# Patient Record
Sex: Female | Born: 1995
Health system: Southern US, Community
[De-identification: ages and names within clinical notes are randomized; demographics above are authoritative.]

## PROBLEM LIST (undated history)

## (undated) DIAGNOSIS — F39 Unspecified mood [affective] disorder: Secondary | ICD-10-CM

## (undated) DIAGNOSIS — N12 Tubulo-interstitial nephritis, not specified as acute or chronic: Secondary | ICD-10-CM

## (undated) DIAGNOSIS — F900 Attention-deficit hyperactivity disorder, predominantly inattentive type: Secondary | ICD-10-CM

## (undated) DIAGNOSIS — R109 Unspecified abdominal pain: Secondary | ICD-10-CM

## (undated) HISTORY — DX: Unspecified mood (affective) disorder: F39

## (undated) HISTORY — DX: Tubulo-interstitial nephritis, not specified as acute or chronic: N12

## (undated) HISTORY — DX: Unspecified abdominal pain: R10.9

## (undated) HISTORY — DX: Attention-deficit hyperactivity disorder, predominantly inattentive type: F90.0

---
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
## 2015-01-09 ENCOUNTER — Ambulatory Visit: Payer: Medicaid Other | Admitting: Pediatrics

## 2015-01-09 ENCOUNTER — Encounter: Payer: Self-pay | Admitting: Pediatrics

## 2015-01-09 NOTE — Progress Notes (Signed)
Pre-Visit Planning  Savannah Goodwin  is a 19 y.o. female referred by Clint GuySMITH,ESTHER P, MD.   Last seen in [facility] on 10/12/2014 for ADHD, nexplanon f/u.   Previous Psych Screenings? yes, 10/13/2014  Treatment plan at last visit included restart Adderall at 10 mg and then increase to 20 mg if needed.  Cont nexplanon.   Clinical Staff Visit Tasks:   - Urine GC/CT due? no - Psych Screenings Due? yes, ASRS, PHQSADs - FS Hgb  Provider Visit Tasks: - Assess ADHD - Assess medication compliance, benefits and side effects  - [facility] Involvement? Maybe - Pertinent Labs? yes Component     Latest Ref Rng 02/22/2014 10/12/2014  Hemoglobin     12.2 - 16.2 g/dL [phone] (A) [phone] (A)

## 2015-03-17 ENCOUNTER — Ambulatory Visit (INDEPENDENT_AMBULATORY_CARE_PROVIDER_SITE_OTHER): Payer: BLUE CROSS/BLUE SHIELD | Admitting: Pediatrics

## 2015-03-17 ENCOUNTER — Encounter: Payer: Self-pay | Admitting: Pediatrics

## 2015-03-17 VITALS — BP 115/71 | HR 90 | Ht 64.57 in | Wt 173.8 lb

## 2015-03-17 DIAGNOSIS — F900 Attention-deficit hyperactivity disorder, predominantly inattentive type: Secondary | ICD-10-CM

## 2015-03-17 DIAGNOSIS — Z3046 Encounter for surveillance of implantable subdermal contraceptive: Secondary | ICD-10-CM

## 2015-03-17 DIAGNOSIS — Z130 Encounter for screening for diseases of the blood and blood-forming organs and certain disorders involving the immune mechanism: Secondary | ICD-10-CM

## 2015-03-17 LAB — POCT HEMOGLOBIN: HEMOGLOBIN: 11.9 g/dL — AB (ref 12.2–16.2)

## 2015-03-17 MED ORDER — AMPHETAMINE-DEXTROAMPHET ER 20 MG PO CP24: 20.0000 mg | ORAL_CAPSULE | Freq: Every day | ORAL | Status: DC

## 2015-03-17 NOTE — Progress Notes (Signed)
THIS RECORD MAY CONTAIN CONFIDENTIAL INFORMATION THAT SHOULD NOT BE RELEASED WITHOUT REVIEW OF THE SERVICE PROVIDER.  Adolescent Medicine Consultation Follow-Up Visit Savannah Goodwin  is a 20 y.o. female referred by Clint Guy, MD here today for follow-up.    Previsit planning completed:  yes Pre-Visit Planning  Savannah Goodwin  is a 20 y.o. female referred by Clint Guy, MD.   Last seen in [facility] on 10/12/2014 for ADHD, nexplanon f/u.   Previous Psych Screenings? yes, 10/13/2014  Treatment plan at last visit included restart Adderall at 10 mg and then increase to 20 mg if needed.  Cont nexplanon.   Clinical Staff Visit Tasks:   - Urine GC/CT due? no - Psych Screenings Due? yes, ASRS, PHQSADs - FS Hgb  Provider Visit Tasks: - Assess ADHD - Assess medication compliance, benefits and side effects  - [facility] Involvement? Maybe - Pertinent Labs? yes Component     Latest Ref Rng 02/22/2014 10/12/2014  Hemoglobin     12.2 - 16.2 g/dL 16.1 (A) 09.6 (A)   Growth Chart Viewed? yes   History was provided by the patient.  PCP Confirmed?  yes  My Chart Activated?   yes   HPI:    On Adderall 20 mg, not taking it anymore because she ran out.  Has found it helpful in the past.  Using organizational strategies which helps.  Adderall helps her stay on task. Still some unpredictable bleeding with nexplanon. Had some urinary frequency and stomach pain.  Not happening at the same time.  May be somewhat constipated.  Does not go every day, maybe every other day.  Urinary symptoms resolved.  Patient's last menstrual period was 02/20/2015 (approximate). Allergies  Allergen Reactions  . Tree Extract Anaphylaxis    TREE NUTS   Outpatient Encounter Prescriptions as of 03/17/2015  Medication Sig  . cetirizine (ZYRTEC) 10 MG tablet Take 1 tablet (10 mg total) by mouth daily.  . clotrimazole (LOTRIMIN) 1 % cream Apply 1 application topically 2 (two) times daily.  .  hydrocortisone 2.5 % cream Apply topically daily as needed. Mixed 1:1 with Eucerin Cream.  . hydrocortisone 2.5 % ointment Apply topically 2 (two) times daily as needed.  . triamcinolone ointment (KENALOG) 0.1 % Apply 1 application topically 2 (two) times daily as needed. Do not use on face. Use sparingly.  Marland Kitchen amphetamine-dextroamphetamine (ADDERALL XR) 20 MG 24 hr capsule Take 1 capsule (20 mg total) by mouth daily with breakfast. FILL ON OR AFTER 04/14/2015  . amphetamine-dextroamphetamine (ADDERALL XR) 20 MG 24 hr capsule Take 1 capsule (20 mg total) by mouth daily with breakfast. FILL ON OR AFTER 05/15/2015  . amphetamine-dextroamphetamine (ADDERALL XR) 20 MG 24 hr capsule Take 1 capsule (20 mg total) by mouth daily with breakfast.  . [DISCONTINUED] amphetamine-dextroamphetamine (ADDERALL XR) 20 MG 24 hr capsule Take 1 capsule (20 mg total) by mouth daily with breakfast. FILL ON OR AFTER 12/21/2014 (Patient not taking: Reported on 03/17/2015)  . [DISCONTINUED] amphetamine-dextroamphetamine (ADDERALL XR) 20 MG 24 hr capsule Take 1 capsule (20 mg total) by mouth daily with breakfast. (Patient not taking: Reported on 03/17/2015)  . [DISCONTINUED] hydrOXYzine (ATARAX/VISTARIL) 25 MG tablet Take 1 tablet (25 mg total) by mouth 3 (three) times daily as needed. (Patient not taking: Reported on 03/17/2015)   No facility-administered encounter medications on file as of 03/17/2015.     Patient Active Problem List   Diagnosis Date Noted  . Surveillance of implantable subdermal contraceptive 06/27/2014  .  Patient requests no residents 08/27/2013  . Allergic rhinitis 07/06/2013  . ADHD (attention deficit hyperactivity disorder), inattentive type 04/05/2013  . Overweight(278.02) 04/05/2013  . History of pyelonephritis 08/28/2006  . Eczema 08/28/2006    Social History   Social History Narrative   Lives in the dorm.  Has a boyfriend.  He is going into the [employer].  Talking about getting married when he leaves.      Confidentiality was discussed with the patient and if applicable, with caregiver as well.      Patient's personal or confidential phone number: [phone]   Tobacco?  no   Drugs/ETOH?  no   Partner preference?  female Sexually Active?  yes    Pregnancy Prevention:  implant, reviewed condoms & plan B   Safe at home, in school & in relationships?  yes   Safe to self?   yes   Guns in the home?  no          The following portions of the patient's history were reviewed and updated as appropriate: allergies, current medications, past social history and problem list.  Physical Exam:  Filed Vitals:   03/17/15 1118  BP: 115/71  Pulse: 90  Height: 5' 4.57" (1.64 m)  Weight: 173 lb 12.8 oz (78.835 kg)   BP 115/71 mmHg  Pulse 90  Ht 5' 4.57" (1.64 m)  Wt 173 lb 12.8 oz (78.835 kg)  BMI 29.31 kg/m2  LMP 02/20/2015 (Approximate) Body mass index: body mass index is 29.31 kg/(m^2). Blood pressure percentiles are 68% systolic and 73% diastolic based on 2000 NHANES data. Blood pressure percentile targets: 90: 123/78, 95: 127/82, 99 + 5 mmHg: 139/95.  Physical Exam  Constitutional: No distress.  Neck: No thyromegaly present.  Cardiovascular: Normal rate and regular rhythm.   No murmur heard. Pulmonary/Chest: Breath sounds normal.  Abdominal: Soft. There is no tenderness. There is no guarding.  Musculoskeletal: She exhibits no edema.  Lymphadenopathy:    She has no cervical adenopathy.  Neurological: She is alert.  Nursing note and vitals reviewed.  PHQ-SADS 03/20/2015 10/13/2014  PHQ-15 2 1   GAD-7 1 4   PHQ-9 1 6   Suicidal Ideation No No  Comment Not at all difficult Very difficult   ASRS 03/20/2015 10/13/2014  Part A Total Symptoms Positive 3 5  Part B Total Symptoms Positive 2 7   Results for orders placed or performed in visit on 03/17/15  POCT hemoglobin  Result Value Ref Range   Hemoglobin 11.9 (A) 12.2 - 16.2 g/dL     Assessment/Plan: 1. ADHD (attention deficit  hyperactivity disorder), inattentive type Symptoms well controlled with current medication regimen, no current comorbidities or side effects. - amphetamine-dextroamphetamine (ADDERALL XR) 20 MG 24 hr capsule; Take 1 capsule (20 mg total) by mouth daily with breakfast. FILL ON OR AFTER 04/14/2015  Dispense: 20 capsule; Refill: 0 - amphetamine-dextroamphetamine (ADDERALL XR) 20 MG 24 hr capsule; Take 1 capsule (20 mg total) by mouth daily with breakfast. FILL ON OR AFTER 05/15/2015  Dispense: 30 capsule; Refill: 0 - amphetamine-dextroamphetamine (ADDERALL XR) 20 MG 24 hr capsule; Take 1 capsule (20 mg total) by mouth daily with breakfast.  Dispense: 30 capsule; Refill: 0  2. Surveillance of implantable subdermal contraceptive Continue with this current method of pregnancy prevention.  3. Screening for iron deficiency anemia - POCT hemoglobin  - Mild anemia, advised to start OTC iron or increase iron rich foods  Discussed urinary symptoms.  Advised to call to be seen same day if symptoms  recur.  Advised to work increasing stool frequency.  Follow-up:  Return in about 3 months (around 06/14/2015) for Med f/u, with any available [facility] Provider.   Medical decision-making:  > 25 minutes spent, more than 50% of appointment was spent discussing diagnosis and management of symptoms

## 2015-03-17 NOTE — Patient Instructions (Signed)
Try pear or prune juice or dried fruits.  Try raisonettes.

## 2015-06-07 ENCOUNTER — Encounter: Payer: Self-pay | Admitting: Pediatrics

## 2015-06-07 ENCOUNTER — Ambulatory Visit (INDEPENDENT_AMBULATORY_CARE_PROVIDER_SITE_OTHER): Payer: BLUE CROSS/BLUE SHIELD | Admitting: Pediatrics

## 2015-06-07 VITALS — BP 103/67 | HR 78 | Ht 64.57 in | Wt 184.8 lb

## 2015-06-07 DIAGNOSIS — B353 Tinea pedis: Secondary | ICD-10-CM

## 2015-06-07 DIAGNOSIS — L309 Dermatitis, unspecified: Secondary | ICD-10-CM | POA: Diagnosis not present

## 2015-06-07 DIAGNOSIS — F900 Attention-deficit hyperactivity disorder, predominantly inattentive type: Secondary | ICD-10-CM

## 2015-06-07 DIAGNOSIS — J302 Other seasonal allergic rhinitis: Secondary | ICD-10-CM

## 2015-06-07 DIAGNOSIS — Z3046 Encounter for surveillance of implantable subdermal contraceptive: Secondary | ICD-10-CM

## 2015-06-07 DIAGNOSIS — Z113 Encounter for screening for infections with a predominantly sexual mode of transmission: Secondary | ICD-10-CM | POA: Diagnosis not present

## 2015-06-07 MED ORDER — TRIAMCINOLONE ACETONIDE 0.1 % EX OINT: 1.0000 "application " | TOPICAL_OINTMENT | Freq: Two times a day (BID) | CUTANEOUS | Status: DC | PRN

## 2015-06-07 MED ORDER — HYDROCORTISONE 2.5 % EX CREA: TOPICAL_CREAM | Freq: Every day | CUTANEOUS | Status: DC | PRN

## 2015-06-07 MED ORDER — CLOTRIMAZOLE 1 % EX CREA: 1.0000 "application " | TOPICAL_CREAM | Freq: Two times a day (BID) | CUTANEOUS | Status: DC

## 2015-06-07 MED ORDER — FEXOFENADINE HCL 180 MG PO TABS: 180.0000 mg | ORAL_TABLET | Freq: Every day | ORAL | Status: DC

## 2015-06-07 NOTE — Progress Notes (Signed)
Pre-Visit Planning  Savannah Goodwin  is a 20 y.o. female referred by Clint GuySMITH,ESTHER P, MD.   Last seen in [facility] Clinic on 03/17/15 for ADHD, nexplanon.  Plan at last visit included continue adderall xr 20 mg daily and nexplanon.  Date and Type of Previous Psych Screenings? Yes  ASRS 03/20/2015  Part A Total Symptoms Positive 3  Part B Total Symptoms Positive 2   PHQ-SADS 03/20/2015  PHQ-15 2  GAD-7 1  PHQ-9 1  Suicidal Ideation No  Comment Not at all difficult    Clinical Staff Visit Tasks:   - Urine GC/CT due? YES - HIV Screening due?  no - Psych Screenings Due? Yes - ASRS and PHQ-SADs   Provider Visit Tasks: - discuss success with adderall  - any concerns for nexplanon?  [facility]- [facility] Involvement? No - Pertinent Labs? No  >5 minutes spent reviewing records and planning for patient's visit.

## 2015-06-07 NOTE — Patient Instructions (Addendum)
Follow up as needed with us.  If you need a different allergy medicine let me know.  Review the financial counseling application and take a look at the healthcare.gov website for other plans  Keep using creams for face and body  Keep using cream for your feet

## 2015-06-07 NOTE — Progress Notes (Signed)
THIS RECORD MAY CONTAIN CONFIDENTIAL INFORMATION THAT SHOULD NOT BE RELEASED WITHOUT REVIEW OF THE SERVICE PROVIDER.  Adolescent Medicine Consultation Follow-Up Visit Savannah Goodwin  is a 20 y.o. female referred by Clint Guy, MD here today for follow-up.    Previsit planning completed:  Yes  Pre-Visit Planning  Savannah Goodwin is a 20 y.o. female referred by Clint Guy, MD.  Last seen in [facility] Clinic on 03/17/15 for ADHD, nexplanon.  Plan at last visit included continue adderall xr 20 mg daily and nexplanon.  Date and Type of Previous Psych Screenings? Yes  ASRS 03/20/2015  Part A Total Symptoms Positive 3  Part B Total Symptoms Positive 2   PHQ-SADS 03/20/2015  PHQ-15 2  GAD-7 1  PHQ-9 1  Suicidal Ideation No  Comment Not at all difficult    Clinical Staff Visit Tasks:  - Urine GC/CT due? YES - HIV Screening due? no - Psych Screenings Due? Yes - ASRS and PHQ-SADs   Provider Visit Tasks: - discuss success with adderall  - any concerns for nexplanon?  [facility] Involvement? No - Pertinent Labs? No  >5 minutes spent reviewing records and planning for patient's visit.         Growth Chart Viewed? yes   History was provided by the patient.  PCP Confirmed?  yes  My Chart Activated?   yes   HPI:    ADHD- hasn't been taking Adderall. She has had to start paying for it and it is about $30. She is not in school but managing well without it at work. She is working as a Public relations account executive. She hopes to restart medications when she restarts school but will likely take a year off before doing this as she did not do well at [employer] first year.   Nexplanon- going well. She gets monthly periods that may be a little longer than normal but no bleeding in between. Sexually active with same partner. Not using condoms.   Her eczema is flaring up again and she is out of cream. Her athletes foot has returned. Her allergies have worsened but  zyrtec did not seem to help in the past. She has never tried another allergy medication except flonase without success.  She will lose her insurance through school in July. We discussed continuing care with [place] despite this and looking into her options. Provided her with financial aid application for here and also discussed healthcare.gov options.   PHQ-SADS 06/08/2015  PHQ-15 0  GAD-7 0  PHQ-9 0  Suicidal Ideation No  Comment   ASRS 06/08/2015  Part A Total Symptoms Positive 4  Part B Total Symptoms Positive 3  Comment Not currently taking meds      Review of Systems  Constitutional: Negative for weight loss and malaise/fatigue.  Eyes: Negative for blurred vision.  Respiratory: Negative for shortness of breath.   Cardiovascular: Negative for chest pain and palpitations.  Gastrointestinal: Negative for nausea, vomiting, abdominal pain and constipation.  Genitourinary: Negative for dysuria.  Musculoskeletal: Negative for myalgias.  Neurological: Negative for dizziness and headaches.  Psychiatric/Behavioral: Negative for depression.     Patient's last menstrual period was 05/26/2015. Allergies  Allergen Reactions  . Tree Extract Anaphylaxis    TREE NUTS   Outpatient Prescriptions Prior to Visit  Medication Sig Dispense Refill  . amphetamine-dextroamphetamine (ADDERALL XR) 20 MG 24 hr capsule Take 1 capsule (20 mg total) by mouth daily with breakfast. FILL ON OR AFTER 04/14/2015 (Patient not taking: Reported on 06/07/2015)  20 capsule 0  . amphetamine-dextroamphetamine (ADDERALL XR) 20 MG 24 hr capsule Take 1 capsule (20 mg total) by mouth daily with breakfast. FILL ON OR AFTER 05/15/2015 (Patient not taking: Reported on 06/07/2015) 30 capsule 0  . amphetamine-dextroamphetamine (ADDERALL XR) 20 MG 24 hr capsule Take 1 capsule (20 mg total) by mouth daily with breakfast. (Patient not taking: Reported on 06/07/2015) 30 capsule 0  . cetirizine (ZYRTEC) 10 MG tablet Take 1 tablet (10 mg  total) by mouth daily. (Patient not taking: Reported on 06/07/2015) 30 tablet 11  . clotrimazole (LOTRIMIN) 1 % cream Apply 1 application topically 2 (two) times daily. (Patient not taking: Reported on 06/07/2015) 60 g 1  . hydrocortisone 2.5 % cream Apply topically daily as needed. Mixed 1:1 with Eucerin Cream. (Patient not taking: Reported on 06/07/2015) 454 g 11  . hydrocortisone 2.5 % ointment Apply topically 2 (two) times daily as needed. (Patient not taking: Reported on 06/07/2015) 454 g 3  . triamcinolone ointment (KENALOG) 0.1 % Apply 1 application topically 2 (two) times daily as needed. Do not use on face. Use sparingly. (Patient not taking: Reported on 06/07/2015) 80 g 1   No facility-administered medications prior to visit.     Patient Active Problem List   Diagnosis Date Noted  . Surveillance of implantable subdermal contraceptive 06/27/2014  . Patient requests no residents 08/27/2013  . Allergic rhinitis 07/06/2013  . ADHD (attention deficit hyperactivity disorder), inattentive type 04/05/2013  . Overweight(278.02) 04/05/2013  . History of pyelonephritis 08/28/2006  . Eczema 08/28/2006     The following portions of the patient's history were reviewed and updated as appropriate: allergies, current medications, past family history, past medical history, past social history and problem list.  Physical Exam:  Filed Vitals:   06/07/15 1607  BP: 103/67  Pulse: 78  Height: 5' 4.57" (1.64 m)  Weight: 184 lb 12.8 oz (83.825 kg)   BP 103/67 mmHg  Pulse 78  Ht 5' 4.57" (1.64 m)  Wt 184 lb 12.8 oz (83.825 kg)  BMI 31.17 kg/m2  LMP 05/26/2015 Body mass index: body mass index is 31.17 kg/(m^2). Blood pressure percentiles are 27% systolic and 62% diastolic based on 2000 NHANES data. Blood pressure percentile targets: 90: 123/78, 95: 127/82, 99 + 5 mmHg: 139/94.  Physical Exam  Constitutional: She is oriented to person, place, and time. She appears well-developed and well-nourished.   HENT:  Head: Normocephalic.  Neck: No thyromegaly present.  Cardiovascular: Normal rate, regular rhythm, normal heart sounds and intact distal pulses.   Pulmonary/Chest: Effort normal and breath sounds normal.  Abdominal: Soft. Bowel sounds are normal. There is no tenderness.  Musculoskeletal: Normal range of motion.  Neurological: She is alert and oriented to person, place, and time.  Skin: Skin is warm and dry.  Psychiatric: She has a normal mood and affect.    Assessment/Plan: 1. ADHD (attention deficit hyperactivity disorder), inattentive type Not currently medicated but not in school. Will readdress when she starts back to school.   2. Routine screening for STI (sexually transmitted infection) Per yearly protocol- negative.  - GC/Chlamydia Probe Amp  3. Eczema Continue creams and good moisturization given that she is in chlorine a lot now. Discussed good lotions and where to use creams.  - triamcinolone ointment (KENALOG) 0.1 %; Apply 1 application topically 2 (two) times daily as needed. Do not use on face. Use sparingly.  Dispense: 80 g; Refill: 6 - hydrocortisone 2.5 % cream; Apply topically daily as needed. Mixed 1:1  with Eucerin Cream.  Dispense: 454 g; Refill: 11  4. Surveillance of implantable subdermal contraceptive Going well with no concerns.   5. Seasonal allergies Will try allergra and d/c zyrtec. Given significant eczema can consider singulair in the future.  - fexofenadine (ALLEGRA) 180 MG tablet; Take 1 tablet (180 mg total) by mouth daily.  Dispense: 30 tablet; Refill: 6  6. Tinea pedis of both feet Continue clotrimazole  - clotrimazole (LOTRIMIN) 1 % cream; Apply 1 application topically 2 (two) times daily.  Dispense: 113 g; Refill: 2   Follow-up:  PRN given changing insurance status   Medical decision-making:  > 25 minutes spent, more than 50% of appointment was spent discussing diagnosis and management of symptoms

## 2015-06-08 LAB — GC/CHLAMYDIA PROBE AMP
CT Probe RNA: NOT DETECTED
GC Probe RNA: NOT DETECTED

## 2015-08-17 ENCOUNTER — Encounter: Payer: Self-pay | Admitting: Pediatrics

## 2015-11-30 ENCOUNTER — Ambulatory Visit (INDEPENDENT_AMBULATORY_CARE_PROVIDER_SITE_OTHER): Payer: PRIVATE HEALTH INSURANCE | Admitting: Pediatrics

## 2015-11-30 ENCOUNTER — Encounter: Payer: Self-pay | Admitting: Pediatrics

## 2015-11-30 VITALS — Temp 97.8°F | Wt 181.2 lb

## 2015-11-30 DIAGNOSIS — H547 Unspecified visual loss: Secondary | ICD-10-CM | POA: Diagnosis not present

## 2015-11-30 DIAGNOSIS — J302 Other seasonal allergic rhinitis: Secondary | ICD-10-CM

## 2015-11-30 DIAGNOSIS — B353 Tinea pedis: Secondary | ICD-10-CM | POA: Diagnosis not present

## 2015-11-30 DIAGNOSIS — L309 Dermatitis, unspecified: Secondary | ICD-10-CM | POA: Diagnosis not present

## 2015-11-30 MED ORDER — CLOTRIMAZOLE 1 % EX CREA: 1.0000 "application " | TOPICAL_CREAM | Freq: Two times a day (BID) | CUTANEOUS | 2 refills | Status: AC

## 2015-11-30 MED ORDER — TRIAMCINOLONE ACETONIDE 0.1 % EX OINT: 1.0000 "application " | TOPICAL_OINTMENT | Freq: Two times a day (BID) | CUTANEOUS | 6 refills | Status: DC | PRN

## 2015-11-30 MED ORDER — HYDROCORTISONE 2.5 % EX CREA: TOPICAL_CREAM | Freq: Every day | CUTANEOUS | 11 refills | Status: AC | PRN

## 2015-11-30 MED ORDER — FEXOFENADINE HCL 180 MG PO TABS: 180.0000 mg | ORAL_TABLET | Freq: Every day | ORAL | 6 refills | Status: DC

## 2015-11-30 NOTE — Progress Notes (Signed)
History was provided by the patient.  Savannah Goodwin is a 20 y.o. female who is here for  Chief Complaint  Patient presents with  . bump  . Eczema    pt was scratching so much that puss came out of them, they had a whole in them   . Loss of Vision    left eye goes completely black, happend twice now, lasts maybe 2-3 mins at a time.   Marland Kitchen.     HPI:  Typically uses topical medications for her eczema but ran out without insurance.  She has been Eucerin and Vaseline.  Eczema flare-up typically occur more in the winter.   Triggered: drying of skin.  There were raised bumps on the lower leg that eventually clear pus.    Pt also having itching of feet. History of athlete's foot.    Vision:  First happened at school in the dorm.  There was an outlet that was sticking out and pt would continue to hit with her head.  Patient did not have associated HA. A few weeks later and after yawning, patient noticed vision in left eye black and gray appearing.  On Oct 30 on the way to walking to machine the vision blackened without warning. Blinking did not improve vision.  Vision slowly came back after about 2 minutes.  Patient may have had less food intake during the second time.  Denies numbness, tingling, headache.  She endorses sensitivity to light, consistently.  Denies nausea, vomiting, phonophobia.       Started in August with floaters when walking into the room.    The following portions of the patient's history were reviewed and updated as appropriate: allergies, current medications, past family history, past medical history, past social history and problem list.  Physical Exam:  Temp 97.8 F (36.6 C)   Wt 181 lb 3.2 oz (82.2 kg)   LMP 11/20/2015   BMI 30.56 kg/m   Growth percentile SmartLinks can only be used for patients less than 20 years old. Patient's last menstrual period was 11/20/2015.    General: Pleasant adult female, Well-appearing, well-nourished.  HEENT: Normocephalic,  atraumatic, MMM. Oropharynx no erythema no exudates. Neck supple, no lymphadenopathy.  CV: Regular rate and rhythm, normal S1 and S2, no murmurs rubs or gallops.  PULM: Comfortable work of breathing. No accessory muscle use. Lungs CTA bilaterally without wheezes, rales, rhonchi.  ABD: Soft, non tender, non distended, normal bowel sounds.  EXT: Warm and well-perfused, capillary refill < 3sec.  Neuro: Grossly intact. No neurologic focalization.  Skin: Hyperpigmented macules diffusely located on the lower legs, hyperpigmented patch on the antecubital fossa bilaterally.  Mild peeling of the feet.     Assessment/Plan:  1. Eczema, unspecified type -Provided supportive care instructions for dry skin.  Instructed patient to use Dove or non-scented soap on the body.  Also gave personal hygiene advice per patient questions. - Mild cases: hydrocortisone 2.5 % cream; Apply topically daily as needed. Mixed 1:1 with Eucerin Cream.  Dispense: 454 g; Refill: 11 - Flare-ups triamcinolone ointment (KENALOG) 0.1 %; Apply 1 application topically 2 (two) times daily as needed. Do not use on face. Use sparingly.  Dispense: 80 g; Refill: 6  2. Tinea pedis of both feet -Provided supportive care instructions including keeping feet dry  - clotrimazole (LOTRIMIN) 1 % cream; Apply 1 application topically 2 (two) times daily.  Dispense: 113 g; Refill: 2  3. Vision problem -Patient with new onset blacking out of vision in the  left eye and floaters that occur in the bright light.  Pt denies associated headache.  No gross abnormalities seen on ophthalmologic exam.   - Amb referral to Pediatric Ophthalmology  4. Chronic seasonal allergic rhinitis, unspecified trigger Patient with constant itching and previously prescribed pt unable at that time to fill due to cost. Now with new insurance  - fexofenadine (ALLEGRA) 180 MG tablet; Take 1 tablet (180 mg total) by mouth daily.  Dispense: 30 tablet; Refill: 6   Return if  symptoms worsen or fail to improve.   Lavella HammockEndya Frye, MD  11/30/15

## 2015-11-30 NOTE — Patient Instructions (Addendum)
To help treat dry skin:  - Use a thick moisturizer such as petroleum jelly, coconut oil, Eucerin, or Aquaphor from face to toes 2 times a day every day.   - Use sensitive skin, moisturizing soaps with no smell (example: Dove or Cetaphil) - Use fragrance free detergent (example: Dreft or another "free and clear" detergent) - Do not use strong soaps or lotions with smells (example: Johnson's lotion or baby wash) - Do not use fabric softener or fabric softener sheets in the laundry.  Will have you seen by eye doctor to evaluate your eye symptoms (blacking out and floaters).

## 2015-12-01 ENCOUNTER — Encounter: Payer: Self-pay | Admitting: Pediatrics

## 2015-12-01 DIAGNOSIS — H547 Unspecified visual loss: Secondary | ICD-10-CM | POA: Insufficient documentation

## 2015-12-01 DIAGNOSIS — B353 Tinea pedis: Secondary | ICD-10-CM | POA: Insufficient documentation

## 2015-12-11 ENCOUNTER — Encounter: Payer: Self-pay | Admitting: Pediatrics

## 2015-12-15 ENCOUNTER — Other Ambulatory Visit: Payer: Self-pay | Admitting: Pediatrics

## 2015-12-15 DIAGNOSIS — L739 Follicular disorder, unspecified: Secondary | ICD-10-CM

## 2015-12-15 MED ORDER — MUPIROCIN 2 % EX OINT: TOPICAL_OINTMENT | CUTANEOUS | 1 refills | Status: DC

## 2015-12-15 NOTE — Progress Notes (Signed)
Advised pt to schedule appt. eRX for Mupirocin sent to [facility].

## 2016-01-03 ENCOUNTER — Encounter: Payer: Self-pay | Admitting: Pediatrics

## 2016-01-03 ENCOUNTER — Ambulatory Visit (INDEPENDENT_AMBULATORY_CARE_PROVIDER_SITE_OTHER): Payer: PRIVATE HEALTH INSURANCE | Admitting: Pediatrics

## 2016-01-03 VITALS — Wt 181.0 lb

## 2016-01-03 DIAGNOSIS — L03113 Cellulitis of right upper limb: Secondary | ICD-10-CM | POA: Diagnosis not present

## 2016-01-03 MED ORDER — SULFAMETHOXAZOLE-TRIMETHOPRIM 800-160 MG PO TABS: ORAL_TABLET | ORAL | 0 refills | Status: DC

## 2016-01-03 NOTE — Progress Notes (Signed)
Subjective:     Patient ID: Savannah Goodwin, female   DOB: June 30, 1995, 20 y.o.   MRN: [id number]  HPI Savannah Goodwin is here with concern of recurring "bumps" on her legs, back and arms.  States most recent lesion is on her arm and has been present for 1 week. Savannah Goodwin states she discussed this with a provider months ago and was advised bleach baths and topical antibiotic.  States she only has access to a shower, so could not do the bleach baths and the antibiotic ointment (to her stated knowledge) was never sent to the pharmacy.  Reports lesions are painful when the occur but eventually open and drain on their own. No modifying factors.  No medication.  Would like to know how to resolve current lesion and stop recurrences.  She has chronic eczema that is managed with moisturizers.  PMH, problem list, medications and allergies, family and social history reviewed and updated as indicated. States she lives with her mother and sister.  States they do not have skin lesions.  Review of Systems  Constitutional: Negative for activity change, appetite change and fever.  Skin: Positive for rash and wound.  All other systems reviewed and are negative.      Objective:   Physical Exam  Constitutional: She appears well-developed and well-nourished.  Skin: Skin is warm and dry. No erythema.  Patient has dry skin at torso and extremities with ashen flakes and eczematoid change at elbows and patches on her back. Few papules on back without pus and not acneic.  Multiple round, hyperpigmented scars on back, arms and legs.  Lesion at volar surface of right forearm that is draining serosanguinous fluid, no pus; induration is about 1.5 inch in diameter; remarks mild discomfort on manipulation.  Nursing note and vitals reviewed.      Assessment:     1. Cellulitis of right upper extremity   Patient history supports recurring boils.  Concern for MRSA.    Plan:     Orders Placed This Encounter  Procedures  . Wound  culture  Swab is taken from lesion on forearm. Meds ordered this encounter  Medications  . sulfamethoxazole-trimethoprim (BACTRIM DS,SEPTRA DS) 800-160 MG tablet    Sig: Take one tablet every 12 hours for 10 days to treat infection    Dispense:  20 tablet    Refill:  0   Provided education on boils and superficial skin infections. Discussed skin care (cleansers, short fingernails) and discussed infection control in home. Discussed medication dosing, desired result and potential side effects. Patient voiced understanding and will follow-up as needed. Will adjust medication if indicated by culture results.  Greater than 50% of this 15 minute face to face encounter spent in counseling for presenting issues. Savannah Goodwin, Savannah Elizardo J, MD

## 2016-01-03 NOTE — Patient Instructions (Signed)
Preventing and treating skin infections  Keep fingernails short to avoid breaking the skin if you do accidentally scratch an infection site.  . Use clean linens daily. This includes towels, washcloths, underwear and sleepwear.  Savannah Goodwin. Wash with an antibacterial soap such as Dial or Hibiclens one to two times per week.  . Take once weekly 15-minute bath in a full tub of water with  to  cup of bleach added to it. If this dries your skin,  apply a skin moisturizing cream after toweling off.   If the bleach bath does not work for you, please look for HIBICLENS liquid cleanser in the pharmacy.

## 2016-01-06 LAB — WOUND CULTURE
GRAM STAIN: NONE SEEN
GRAM STAIN: NONE SEEN
GRAM STAIN: NONE SEEN

## 2016-03-22 ENCOUNTER — Telehealth: Payer: Self-pay | Admitting: *Deleted

## 2016-03-22 NOTE — Telephone Encounter (Signed)
Caller stated she is trying to get into the [employer] and needs a letter or medical record stating she is no longer taking adderall.  Since she is a patient of Dr. Katrinka BlazingSmith, not sure who to forward to.

## 2016-03-27 ENCOUNTER — Ambulatory Visit (INDEPENDENT_AMBULATORY_CARE_PROVIDER_SITE_OTHER): Payer: PRIVATE HEALTH INSURANCE | Admitting: Pediatrics

## 2016-03-27 ENCOUNTER — Encounter: Payer: Self-pay | Admitting: Pediatrics

## 2016-03-27 VITALS — BP 118/64 | Ht 64.96 in | Wt 184.4 lb

## 2016-03-27 DIAGNOSIS — Z0100 Encounter for examination of eyes and vision without abnormal findings: Secondary | ICD-10-CM | POA: Diagnosis not present

## 2016-03-27 DIAGNOSIS — Z113 Encounter for screening for infections with a predominantly sexual mode of transmission: Secondary | ICD-10-CM

## 2016-03-27 DIAGNOSIS — Z0110 Encounter for examination of ears and hearing without abnormal findings: Secondary | ICD-10-CM | POA: Diagnosis not present

## 2016-03-27 DIAGNOSIS — F909 Attention-deficit hyperactivity disorder, unspecified type: Secondary | ICD-10-CM | POA: Diagnosis not present

## 2016-03-27 NOTE — Telephone Encounter (Signed)
Alfonso RamusCaroline Hacker was the most recent provider to manage her ADHD, and Ms. Maxwell CaulHacker might be willing to write the requested letter.

## 2016-03-27 NOTE — Progress Notes (Signed)
   Adolescent Well Care Visit Savannah Goodwin is a 21 y.o. female who is here for well care.    PCP:  Clint GuyEsther P Smith, MD   History was provided by the patient.  Current Issues: Current concerns include  Clearance for enlisting in military:  Patient desires to enlist in the [employer] and has been told that she does not meet clearance criteria due to need of more documentation.  Has previously been diagnosed with ADHD and was on Adderall for brief period.  Currently not on Adderall and reports that her last prescription she picked up from [facility] was October 2016.  As per patient, military is requesting documentation from prescribing physician that she has been stable off of ADHD medications to be cleared.    She is currently working at [employer] call center and doing well with no issues.   RAAPS negative PHQ9   Physical Exam:  Vitals:   03/27/16 1546  BP: 118/64  Weight: 184 lb 6.4 oz (83.6 kg)  Height: 5' 4.96" (1.65 m)   BP 118/64   Ht 5' 4.96" (1.65 m)   Wt 184 lb 6.4 oz (83.6 kg)   LMP 03/26/2016   BMI 30.72 kg/m  Body mass index: body mass index is 30.72 kg/m. Growth percentile SmartLinks can only be used for patients less than 21 years old.   Hearing Screening   Method: Audiometry   125Hz  250Hz  500Hz  1000Hz  2000Hz  3000Hz  4000Hz  6000Hz  8000Hz   Right ear:   20 20 20  20     Left ear:   20 20 20  20       Visual Acuity Screening   Right eye Left eye Both eyes  Without correction: 20/20 20/20 20/20   With correction:        Physical exam deferred.   Assessment and Plan:   Savannah Goodwin is a 21 yo F who is here for acute visit due to need for additional documentation of ADHD diagnosis and medical management.  As this Clinical research associatewriter was nor the diagnosing physician or prescribing physician, consultation with last prescribing provider Alfonso Ramusaroline Hacker FNP and extensive chart review with her was done.  Patient had self reported symptoms of ADHD and Vanderbilt Assessment performed and  scaled in chart with multiple follow up visits concerning medication management.  Conners ADHD assessment was provided and [facility].  She will score this and follow up with [facility] in approximately 1 week to assess for ADHD and provide documentation needed.    Hearing screening result:normal Vision screening result: normal     Return in about 1 week (around 04/03/2016) for follow up Conners military clearance with Alfonso Ramusaroline Hacker.Savannah Goodwin.  Savannah Urieta L Evony Rezek, MD

## 2016-03-27 NOTE — Telephone Encounter (Signed)
See patient has appointments coming up.

## 2016-03-28 LAB — GC/CHLAMYDIA PROBE AMP
CT PROBE, AMP APTIMA: NOT DETECTED
GC PROBE AMP APTIMA: NOT DETECTED

## 2016-03-28 LAB — POCT RAPID HIV: RAPID HIV, POC: NEGATIVE

## 2016-03-28 NOTE — Telephone Encounter (Signed)
Patient will be seen next week for f/u regarding ADHD and need for this letter. She completed Conner's Self Report at visit with Dr. Kennedy BuckerGrant which will be scored prior to her appointment next week.

## 2016-04-03 ENCOUNTER — Encounter: Payer: Self-pay | Admitting: Pediatrics

## 2016-04-03 NOTE — Progress Notes (Signed)
Conners Self-Report Assessment Date completed:  04/03/16  Inconsistency Index Total: 2 (Possible Inconsistency if >9) No of Absolute Differences:  0 (Possible Inconsistency if >2)  Positive Impression:  0 (Possible Positive Response Style if >4) Negative Impression:  1 (Possible Negative Response Style if >5)  DSM-5 Total Symptom Counts: ADHD Inattentive:  0 (Age <16 criteria probably met if Total Symptom Count >6, Age >17 probably met if Total Symptom Count >5) ADHD Hyperactive-Impulsive:  0 (Age <16 criteria probably met if Total Symptom Count >6, Age >17 probably met if Total Symptom Count >5) ADHD Combined:  No. Conduct Disorder:  0 (Criteria probably met if Total Symptom Count >3) Oppositional Defiant Disorder:  0 (Criteria probably met if Total Symptom Count >4) Impairment:  Problems that make school hard:  1 Problems that make friendships really hard:  0 Problems that make things really hard at home:  0  Connors 3 ADHD Index: Total Transposed Score:  0, Index Probably Score:  26%  Screener Items: Further investigation indicated regarding Anxiety:  Yes.   Further investigation indicated regarding Depression:  No. Severe Conduct Critical Items Immediate Attention Recommended:  No.  Profile Total, T-Score Inattention:  3, (T-Score: <40) Hyperactivity/Impulsivity:  5,  (T-Score: 45) Learning Problems:  4,  (T-Score: 45) [place]/Aggression:  0,  (T-Score: <40) Family Relations:  0,  (T-Score: <40) DMS-5 ADHD Inattentive:  0,  (T-Score: <40) DSM-5 ADHD Hyperactive-Impulsive:  3,  (T-Score: <40) DSM-5 Conduct Disorder:  0,  (T-Score: <40) DSM-5 Oppositional Defiant Disorder:  3,  (T-Score: <40)

## 2016-04-04 ENCOUNTER — Encounter: Payer: Self-pay | Admitting: Pediatrics

## 2016-04-04 ENCOUNTER — Ambulatory Visit (INDEPENDENT_AMBULATORY_CARE_PROVIDER_SITE_OTHER): Payer: PRIVATE HEALTH INSURANCE | Admitting: Pediatrics

## 2016-04-04 VITALS — BP 124/78 | HR 87 | Ht 65.0 in | Wt 184.6 lb

## 2016-04-04 DIAGNOSIS — F909 Attention-deficit hyperactivity disorder, unspecified type: Secondary | ICD-10-CM | POA: Diagnosis not present

## 2016-04-04 NOTE — Patient Instructions (Signed)
Follow up with us as needed  Nexplanon due to be replaced next year  Good luck in the [employer]!

## 2016-04-04 NOTE — Progress Notes (Signed)
THIS RECORD MAY CONTAIN CONFIDENTIAL INFORMATION THAT SHOULD NOT BE RELEASED WITHOUT REVIEW OF THE SERVICE PROVIDER.  Adolescent Medicine Consultation Follow-Up Visit Savannah Goodwin  is a 21 y.o. female referred by Clint GuySmith, Esther P, MD here today for follow-up regarding medical clearance to join [employer].    Patient presents for medical clearance to join [employer], as she was previously diagnosed with ADHD requiring medication. Has not used any medication since 2016. Completed Conner's prior to visit.  Reviewed Conner's results which did not indicate concern for ADHD. Questionable concern for anxiety. Discussed with patient who denies anxiety.   Chief Complaint  Patient presents with  . Follow-up    Patient's last menstrual period was 03/26/2016. Allergies  Allergen Reactions  . Tree Extract Anaphylaxis    TREE NUTS   Outpatient Medications Prior to Visit  Medication Sig Dispense Refill  . clotrimazole (LOTRIMIN) 1 % cream Apply 1 application topically 2 (two) times daily. 113 g 2  . hydrocortisone 2.5 % cream Apply topically daily as needed. Mixed 1:1 with Eucerin Cream. 454 g 11  . triamcinolone ointment (KENALOG) 0.1 % Apply 1 application topically 2 (two) times daily as needed. Do not use on face. Use sparingly. 80 g 6   No facility-administered medications prior to visit.      Patient Active Problem List   Diagnosis Date Noted  . Folliculitis 12/15/2015  . Vision problem 12/01/2015  . Tinea pedis of both feet 12/01/2015  . Surveillance of implantable subdermal contraceptive 06/27/2014  . Patient requests no residents 08/27/2013  . Allergic rhinitis 07/06/2013  . ADHD (attention deficit hyperactivity disorder), inattentive type 04/05/2013  . Overweight(278.02) 04/05/2013  . History of pyelonephritis 08/28/2006  . Eczema 08/28/2006    Physical Exam:  Vitals:   04/04/16 1447  BP: 124/78  Pulse: 87  Weight: 184 lb 9.6 oz (83.7 kg)  Height: 5\' 5"  (1.651 m)   BP  124/78 (BP Location: Right Arm, Patient Position: Sitting, Cuff Size: Normal)   Pulse 87   Ht 5\' 5"  (1.651 m)   Wt 184 lb 9.6 oz (83.7 kg)   LMP 03/26/2016   BMI 30.72 kg/m  Body mass index: body mass index is 30.72 kg/m. Growth percentile SmartLinks can only be used for patients less than 21 years old.   Physical Exam  Constitutional: She is oriented to person, place, and time. She appears well-developed and well-nourished. No distress.  HENT:  Head: Normocephalic and atraumatic.  Eyes: Conjunctivae and EOM are normal. Right eye exhibits no discharge. Left eye exhibits no discharge.  Pulmonary/Chest: Effort normal. No respiratory distress.  Neurological: She is alert and oriented to person, place, and time.  Skin: Skin is warm and dry.  Psychiatric: She has a normal mood and affect. Her behavior is normal.    Assessment/Plan: 1. ADHD Stable without medication for >2 years. Letter provided today for medical clearance to join [employer].   Follow-up:  Return if symptoms worsen or fail to improve.   Tarri AbernethyAbigail J Lemuel Boodram, MD, MPH PGY-2 [facility]

## 2016-04-25 ENCOUNTER — Other Ambulatory Visit: Payer: Self-pay | Admitting: Pediatrics

## 2016-04-25 DIAGNOSIS — L309 Dermatitis, unspecified: Secondary | ICD-10-CM

## 2016-05-03 ENCOUNTER — Other Ambulatory Visit: Payer: Self-pay | Admitting: Pediatrics

## 2016-05-03 DIAGNOSIS — L309 Dermatitis, unspecified: Secondary | ICD-10-CM

## 2017-02-20 ENCOUNTER — Ambulatory Visit: Payer: Self-pay | Admitting: Family Medicine

## 2017-02-20 ENCOUNTER — Other Ambulatory Visit: Payer: Self-pay

## 2017-02-20 ENCOUNTER — Ambulatory Visit (INDEPENDENT_AMBULATORY_CARE_PROVIDER_SITE_OTHER): Payer: BLUE CROSS/BLUE SHIELD | Admitting: Family Medicine

## 2017-02-20 ENCOUNTER — Encounter: Payer: Self-pay | Admitting: Family Medicine

## 2017-02-20 VITALS — BP 128/84 | HR 100 | Temp 98.2°F | Ht 65.0 in | Wt 196.0 lb

## 2017-02-20 DIAGNOSIS — B353 Tinea pedis: Secondary | ICD-10-CM

## 2017-02-20 DIAGNOSIS — L739 Follicular disorder, unspecified: Secondary | ICD-10-CM | POA: Diagnosis not present

## 2017-02-20 MED ORDER — TERBINAFINE HCL 250 MG PO TABS: 250.0000 mg | ORAL_TABLET | Freq: Every day | ORAL | 0 refills | Status: AC

## 2017-02-20 MED ORDER — MUPIROCIN 2 % EX OINT: 1.0000 "application " | TOPICAL_OINTMENT | Freq: Three times a day (TID) | CUTANEOUS | 0 refills | Status: DC

## 2017-02-20 NOTE — Patient Instructions (Addendum)
Hibiclens for antiseptic wash  Warm compresses of skin areas. Topical antibiotic mupirocin three times a day for 10 days.   Things to watch out for are redness, warm, spreading.    For athletes foot  Terbinafine once daily for 2 weeks.   Folliculitis Folliculitis is inflammation of the hair follicles. Folliculitis most commonly occurs on the scalp, thighs, legs, back, and buttocks. However, it can occur anywhere on the body. What are the causes? This condition may be caused by:  A bacterial infection (common).  A fungal infection.  A viral infection.  Coming into contact with certain chemicals, especially oils and tars.  Shaving or waxing.  Applying greasy ointments or creams to your skin often.  Long-lasting folliculitis and folliculitis that keeps coming back can be caused by bacteria that live in the nostrils. What increases the risk? This condition is more likely to develop in people with:  A weakened immune system.  Diabetes.  Obesity.  What are the signs or symptoms? Symptoms of this condition include:  Redness.  Soreness.  Swelling.  Itching.  Small white or yellow, pus-filled, itchy spots (pustules) that appear over a reddened area. If there is an infection that goes deep into the follicle, these may develop into a boil (furuncle).  A group of closely packed boils (carbuncle). These tend to form in hairy, sweaty areas of the body.  How is this diagnosed? This condition is diagnosed with a skin exam. To find what is causing the condition, your health care provider may take a sample of one of the pustules or boils for testing. How is this treated? This condition may be treated by:  Applying warm compresses to the affected areas.  Taking an antibiotic medicine or applying an antibiotic medicine to the skin.  Applying or bathing with an antiseptic solution.  Taking an over-the-counter medicine to help with itching.  Having a procedure to drain any  pustules or boils. This may be done if a pustule or boil contains a lot of pus or fluid.  Laser hair removal. This may be done to treat long-lasting folliculitis.  Follow these instructions at home:  If directed, apply heat to the affected area as often as told by your health care provider. Use the heat source that your health care provider recommends, such as a moist heat pack or a heating pad. ? Place a towel between your skin and the heat source. ? Leave the heat on for 20-30 minutes. ? Remove the heat if your skin turns bright red. This is especially important if you are unable to feel pain, heat, or cold. You may have a greater risk of getting burned.  If you were prescribed an antibiotic medicine, use it as told by your health care provider. Do not stop using the antibiotic even if you start to feel better.  Take over-the-counter and prescription medicines only as told by your health care provider.  Do not shave irritated skin.  Keep all follow-up visits as told by your health care provider. This is important. Get help right away if:  You have more redness, swelling, or pain in the affected area.  Red streaks are spreading from the affected area.  You have a fever. This information is not intended to replace advice given to you by your health care provider. Make sure you discuss any questions you have with your health care provider. Document Released: 03/18/2001 Document Revised: 07/28/2015 Document Reviewed: 10/28/2014 Elsevier Interactive Patient Education  2018 [employer]  Athlete's Foot Athlete's foot (tinea pedis) is a fungal infection of the skin on the feet. It often occurs on the skin that is between or underneath the toes. It can also occur on the soles of the feet. The infection can spread from person to person (is contagious). Follow these instructions at home:  Apply or take over-the-counter and prescription medicines only as told by your doctor.  Keep all  follow-up visits as told by your doctor. This is important.  Do not scratch your feet.  Keep your feet dry: ? Wear cotton or wool socks. Change your socks every day or if they become wet. ? Wear shoes that allow air to move around, such as sandals or canvas tennis shoes.  Wash and dry your feet: ? Every day or as told by your doctor. ? After exercising. ? Including the area between your toes.  Wear sandals in wet areas, such as locker rooms and shared showers.  Do not share any of these items: ? Towels. ? Nail clippers. ? Other personal items that touch your feet.  If you have diabetes, keep your blood sugar under control. Contact a doctor if:  You have a fever.  You have swelling, soreness, warmth, or redness in your foot.  You are not getting better with treatment.  Your symptoms get worse.  You have new symptoms. This information is not intended to replace advice given to you by your health care provider. Make sure you discuss any questions you have with your health care provider. Document Released: 06/26/2007 Document Revised: 06/15/2015 Document Reviewed: 07/11/2014 Elsevier Interactive Patient Education  2018 [employer].

## 2017-02-20 NOTE — Assessment & Plan Note (Signed)
Patient has small scattered areas of folliculitis on her extremities and in her groin area.  No cellulitis on exam.  Patient is afebrile and well-appearing.  Discussed good hygiene and recommended Hibiclens for antiseptic.  Also discussed trial of topical mupirocin ointment as well as warm compresses.

## 2017-02-20 NOTE — Assessment & Plan Note (Signed)
Exam consistent with tinea pedis that per history has failed topical clotrimazole.  Treat with terbinafine 250 daily for 14 days.  Given return precautions

## 2017-02-20 NOTE — Progress Notes (Signed)
Subjective:  Savannah Goodwin is a 22 y.o. female who presents to the [facility] today to establish as a new patient.  HPI:  Previously seen at [facility], transferring for adult care.  Has multiple concerns today including abdominal pain, athlete's foot, a vein in her vagina that is prominent and only there intermittently, bumps on her skin, eczema, redness of stretch marks at nexplanon site. She states the most important concerns to her today are the bumps on her skin and her athletes foot.   Skin rash -States her eczema has been not under good control as she was having difficulty establish with adult physician. She recently restarted kenalog and clotrimazole after seeing a different doctor for one visit.  - Has small bumps at various locations on her leg, groin area, arms.  Previously had been told to do a bleach bath and take an oral antibiotic for this but it has been some time since she has done this. -States that she does not have issues with this when her eczema is under good control -No fever or chills, no redness or warmth, no purulent drainage, no itching  Athlete's foot -Had long-standing issues with athlete's foot that started on her right and since she had not been able to get her clotrimazole cream it has spread to both feet -Itchy and worsening -Has been doing over-the-counter blue star spray but without much relief    ROS: per HPI, otherwise all systems reviewed and negative  PMH:  The following were reviewed and entered/updated in epic: Past Medical History:  Diagnosis Date  . Abdominal  pain, other specified site 06/24/2012  . ADHD (attention deficit hyperactivity disorder), inattentive type 04/05/2013   Probably ADHD.  Some evidence of adjustment disorder but may be due to ADHD.  Treat ADHD and if persistent symptoms consider management of depressive symptoms as well.   . Mood disorder (HCC) 05/30/2010  . Pyelonephritis    Patient Active Problem List   Diagnosis Date Noted   . Folliculitis 12/15/2015  . Tinea pedis of both feet 12/01/2015  . Surveillance of implantable subdermal contraceptive 06/27/2014  . Seasonal allergies 07/06/2013  . Overweight(278.02) 04/05/2013  . Eczema 08/28/2006   Past Surgical History:  Procedure Laterality Date  . stitches  2002   on foot, after stepped on pen    Family History  Problem Relation Age of Onset  . Stroke Mother   . Psoriasis Mother   . Depression Mother   . High blood pressure Mother   . Hyperlipidemia Maternal Grandmother   . Breast cancer Maternal Grandmother 34  . ADD / ADHD Sister   . Mental retardation Sister   . Other Sister        prediabetes  . Pancreatic cancer Maternal Grandfather   . Alzheimer's disease Paternal Grandmother   . Asthma Maternal Aunt   . Alcohol abuse Maternal Uncle   . Diabetes Neg Hx     Medications- reviewed and updated Current Outpatient Medications  Medication Sig Dispense Refill  . DiphenhydrAMINE HCl (BENADRYL ALLERGY PO) Take 1 tablet by mouth as needed.    . clotrimazole (LOTRIMIN) 1 % cream Apply 1 application topically 2 (two) times daily. 113 g 2  . hydrocortisone 2.5 % cream Apply topically daily as needed. Mixed 1:1 with Eucerin Cream. 454 g 11  . triamcinolone ointment (KENALOG) 0.1 % APPLY 1 APPLICATION TOPICALLY 2 (TWO) TIMES DAILY AS NEEDED. DO NOT USE ON FACE. USE SPARINGLY. 80 g 1   No current  facility-administered medications for this visit.     Allergies-reviewed and updated Allergies  Allergen Reactions  . Tree Extract Anaphylaxis    TREE NUTS    Social History   Socioeconomic History  . Marital status: Single    Spouse name: None  . Number of children: None  . Years of education: None  . Highest education level: None  Social Needs  . Financial resource strain: None  . Food insecurity - worry: None  . Food insecurity - inability: None  . Transportation needs - medical: None  . Transportation needs - non-medical: None  Occupational  History  . None  Tobacco Use  . Smoking status: Never Smoker  . Smokeless tobacco: Never Used  Substance and Sexual Activity  . Alcohol use: Yes    Comment: 3-4 drinks about 2-4 times a month  . Drug use: No  . Sexual activity: Yes    Partners: Male    Birth control/protection: Implant  Other Topics Concern  . None  Social History Narrative   Lives at home with mother and sister. Has a dog.   Has a boyfriend.     Owns a car.    Objective:  Physical Exam: BP 128/84 (BP Location: Left Arm, Patient Position: Sitting, Cuff Size: Large)   Pulse 100   Temp 98.2 F (36.8 C) (Oral)   Ht 5\' 5"  (1.651 m)   Wt 196 lb (88.9 kg)   LMP 02/19/2017 (Exact Date)   SpO2 100%   BMI 32.62 kg/m   Gen: NAD, resting comfortably HEENT: [place], AT. PERRL. MMM, oropharynx nonerythematous. CV: RRR with no murmurs appreciated Pulm: NWOB, CTAB with no crackles, wheezes, or rhonchi GI: Normal bowel sounds present. Soft, Nontender, Nondistended. MSK: no edema, cyanosis, or clubbing noted Skin: warm, dry. No warmth or redness or swelling over L arm nexplanon site. Scaly hyperkeratotic rash over R foot most prominent in interdigit areas.  Small scattered erythematous papules with closed comedones along antecubital, groin, behind left knee. Neuro: grossly normal, moves all extremities  Assessment/Plan:  Tinea pedis of both feet Exam consistent with tinea pedis that per history has failed topical clotrimazole.  Treat with terbinafine 250 daily for 14 days.  Given return precautions  Folliculitis Patient has small scattered areas of folliculitis on her extremities and in her groin area.  No cellulitis on exam.  Patient is afebrile and well-appearing.  Discussed good hygiene and recommended Hibiclens for antiseptic.  Also discussed trial of topical mupirocin ointment as well as warm compresses.  Patient to make follow-up appointment at her convenience to discuss her various concerns.  Discussed doing a Pap  smear in the near future at which time we will also take a look at her vaginal vein that is concerning her.  Leland HerElsia J Yoo, DO PGY-2,  Family Medicine 02/20/2017 1:50 PM

## 2017-03-12 ENCOUNTER — Other Ambulatory Visit: Payer: Self-pay | Admitting: Family Medicine

## 2017-03-12 ENCOUNTER — Other Ambulatory Visit: Payer: Self-pay

## 2017-03-12 ENCOUNTER — Ambulatory Visit (INDEPENDENT_AMBULATORY_CARE_PROVIDER_SITE_OTHER): Payer: BLUE CROSS/BLUE SHIELD | Admitting: Family Medicine

## 2017-03-12 ENCOUNTER — Encounter: Payer: Self-pay | Admitting: Family Medicine

## 2017-03-12 VITALS — BP 110/80 | HR 65 | Temp 97.8°F | Ht 65.0 in | Wt 197.2 lb

## 2017-03-12 DIAGNOSIS — L739 Follicular disorder, unspecified: Secondary | ICD-10-CM

## 2017-03-12 DIAGNOSIS — L309 Dermatitis, unspecified: Secondary | ICD-10-CM | POA: Diagnosis not present

## 2017-03-12 MED ORDER — BETAMETHASONE DIPROPIONATE 0.05 % EX CREA: TOPICAL_CREAM | Freq: Two times a day (BID) | CUTANEOUS | 0 refills | Status: AC

## 2017-03-12 NOTE — Progress Notes (Signed)
    Subjective:  Savannah Goodwin is a 22 y.o. female [facility] with a chief complaint of skin issues.  HPI:  Previously seen on 02/20/17 wit multiple skin issues and was treated for both folliculitis and tinea pedis at that time.  Patient states that her folliculitis seems to have resolved after good skin cleansing with hibiclens and treating infected follicles with mupirocin.  Her feet seem to look better with less peeling and scale but continues to be very itchy with thickened skin and some spots on the top of her foot.  No fever/chills. No drainage. No purulence. No n/v. Has been using good moisturizer nightly.  ROS: Per HPI  Objective:  Physical Exam: BP 110/80   Pulse 65   Temp 97.8 F (36.6 C) (Oral)   Ht 5\' 5"  (1.651 m)   Wt 197 lb 3.2 oz (89.4 kg)   LMP 02/19/2017 (Exact Date)   SpO2 99%   BMI 32.82 kg/m   Gen: NAD, resting comfortably Skin: Thickened hyperkeratoic areas on the tops of her feet with some pale vesicular spots. No scale. Interdigit areas and soles appear normal and supple. Neuro: grossly normal, moves all extremities Psych: Normal affect and thought content   Assessment/Plan:  Eczema Exam most consistent with dyshydrotic eczema. She had diffuse darkened skin spots all over her body and face which appear like old scars from numerous eczema flares patient has had in the past. Treat acutely symptomatic eczema of her feet with high potency steroid for 2 weeks, instructed patient to deescalate to lower potency if she is still symptomatic at that time. Reinforced good skin hygiene and maintenance with consistent use of moisturizers. Referral to dermatology placed today given her significant ezcema history.    Savannah HerElsia J Wallace Cogliano, DO PGY-2, [facility] 03/12/2017 3:29 PM

## 2017-03-12 NOTE — Patient Instructions (Addendum)
It was good to see you today!  For your eczema try betamethasone high potency cream twice a day for 2 weeks, then you can go back to your usual steroid cream.  Referral to dermatology placed today, let us know if you have not heard back about scheduling after 2 weeks.  Make an appointment to come back whenever you want for your pap smear.   Take care,  Dr. Leland HerElsia J Leevi Cullars, DO [facility]

## 2017-03-13 NOTE — Assessment & Plan Note (Signed)
Exam most consistent with dyshydrotic eczema. She had diffuse darkened skin spots all over her body and face which appear like old scars from numerous eczema flares patient has had in the past. Treat acutely symptomatic eczema of her feet with high potency steroid for 2 weeks, instructed patient to deescalate to lower potency if she is still symptomatic at that time. Reinforced good skin hygiene and maintenance with consistent use of moisturizers. Referral to dermatology placed today given her significant ezcema history.

## 2017-04-14 ENCOUNTER — Other Ambulatory Visit: Payer: Self-pay | Admitting: Family Medicine

## 2017-04-14 DIAGNOSIS — L739 Follicular disorder, unspecified: Secondary | ICD-10-CM

## 2017-06-19 ENCOUNTER — Other Ambulatory Visit: Payer: Self-pay

## 2017-06-19 ENCOUNTER — Ambulatory Visit (INDEPENDENT_AMBULATORY_CARE_PROVIDER_SITE_OTHER): Payer: BLUE CROSS/BLUE SHIELD | Admitting: Family Medicine

## 2017-06-19 ENCOUNTER — Encounter: Payer: Self-pay | Admitting: Family Medicine

## 2017-06-19 VITALS — BP 135/65 | HR 74 | Temp 98.9°F | Ht 65.0 in | Wt 183.0 lb

## 2017-06-19 DIAGNOSIS — L309 Dermatitis, unspecified: Secondary | ICD-10-CM

## 2017-06-19 DIAGNOSIS — Z3046 Encounter for surveillance of implantable subdermal contraceptive: Secondary | ICD-10-CM

## 2017-06-19 DIAGNOSIS — Z309 Encounter for contraceptive management, unspecified: Secondary | ICD-10-CM

## 2017-06-19 MED ORDER — ETONOGESTREL 68 MG ~~LOC~~ IMPL
68.0000 mg | DRUG_IMPLANT | Freq: Once | SUBCUTANEOUS | Status: AC
  Administered 2017-06-19: 68 mg via SUBCUTANEOUS

## 2017-06-19 MED ORDER — TRIAMCINOLONE ACETONIDE 0.1 % EX OINT: 1.0000 "application " | TOPICAL_OINTMENT | Freq: Two times a day (BID) | CUTANEOUS | 1 refills | Status: AC | PRN

## 2017-06-19 NOTE — Progress Notes (Signed)
    Subjective:  Savannah Goodwin is a 22 y.o. female who presents to the [facility] today for Nexplanon removal and reinsertion.  HPI:   States has been 3 years since her last Nexplanon.  She had some spotting when she initially started but that resolved.  She has recently noticed some increased spotting towards the last few weeks. Has not recently been sexually active.  Boyfriend  ROS: Per HPI  Objective:  Physical Exam: BP 135/65 (BP Location: Right Arm, Patient Position: Sitting, Cuff Size: Normal)   Pulse 74   Temp 98.9 F (37.2 C) (Oral)   Ht  (1.651 m)   Wt 183 lb (83 kg)   SpO2 98%   BMI 30.45 kg/m   Gen: NAD, resting comfortably Psych: Normal affect and thought content  Assessment/Plan:  1. Encounter for contraceptive management, unspecified type Chart review was placed Jun 17, 2014.  See procedure note for removal and replacement of nexplanon.  - etonogestrel (NEXPLANON) implant 68 mg  2. Eczema Patient requested refill.  is - triamcinolone ointment (KENALOG) 0.1 %; Apply 1 application topically 2 (two) times daily as needed. Do not use on face. Use sparingly.  Dispense: 80 g; Refill: 1   PROCEDURE NOTE: NEXPLANON  REMOVAL AND REINSERTION  PRE-OP DIAGNOSIS: desired long-term, reversible contraception   POST-OP DIAGNOSIS: Same   PROCEDURE: Nexplanon  removal and placement  Performing Physician: Leland Her   PROCEDURE:  Written informed consent obtained Site (check): L arm        Sterile Preparation:          Betadine        [_]     Chloraprep          Patient given informed consent and signed copy in the chart. L arm area prepped and draped in the usual sterile fashion. Three cc of lidocaine with epinephrine 2% used for local anesthesia. A small stab incision was made close to the nexplanon with scalpel. Hemostats were used to withdraw the nexplanon. Nexplanon  trocar was inserted subcutaneously into the incision site and then Nexplanon  capsule  delivered subcutaneously. Trocar was removed from the insertion site. Nexplanon  capsule was palpated by provider and patient to assure satisfactory placement.  Estimated blood loss: minimal Dressings applied: steristrip Followup: The patient tolerated the procedure well without complications.  Standard post-procedure care wass explained and return precautions were given.  Lot number: [id number]  Leland Her, DO PGY-2,  [facility] 06/19/2017 10:52 AM

## 2017-06-19 NOTE — Patient Instructions (Signed)
Your Nexplanon was removed and reinserted today.  If you have sex, remember to use condoms to prevent pregnancy and to prevent sexually transmitted infections.  Leave the outside bandage on for 24 hours.  Leave the smaller bandages on for 3-5 days or until they fall off on their own.  Keep the area clean and dry for 3-5 days.  There is usually bruising or swelling at and around the removal site for a few days to a week after the removal.  If you see redness or pus draining from the removal site, call us immediately.

## 2017-06-21 ENCOUNTER — Encounter: Payer: Self-pay | Admitting: Family Medicine

## 2021-01-31 ENCOUNTER — Encounter: Payer: BLUE CROSS/BLUE SHIELD | Admitting: Student

## 2021-02-07 ENCOUNTER — Encounter: Payer: BLUE CROSS/BLUE SHIELD | Admitting: Nurse Practitioner

## 2021-06-26 ENCOUNTER — Encounter: Payer: Self-pay | Admitting: *Deleted
